# Patient Record
Sex: Male | Born: 1975 | Race: White | Marital: Married | Smoking: Never smoker
Health system: Southern US, Community
[De-identification: ages and names within clinical notes are randomized; demographics above are authoritative.]

## PROBLEM LIST (undated history)

## (undated) HISTORY — PX: WISDOM TOOTH EXTRACTION: SHX21

---

## 2004-10-20 HISTORY — PX: SHOULDER ARTHROSCOPY WITH LABRAL REPAIR: SHX5691

## 2011-10-21 HISTORY — PX: VASECTOMY: SHX75

## 2019-12-05 DIAGNOSIS — H43813 Vitreous degeneration, bilateral: Secondary | ICD-10-CM | POA: Diagnosis not present

## 2019-12-19 DIAGNOSIS — M9904 Segmental and somatic dysfunction of sacral region: Secondary | ICD-10-CM | POA: Diagnosis not present

## 2019-12-19 DIAGNOSIS — M24571 Contracture, right ankle: Secondary | ICD-10-CM | POA: Diagnosis not present

## 2019-12-19 DIAGNOSIS — M9903 Segmental and somatic dysfunction of lumbar region: Secondary | ICD-10-CM | POA: Diagnosis not present

## 2019-12-19 DIAGNOSIS — M24572 Contracture, left ankle: Secondary | ICD-10-CM | POA: Diagnosis not present

## 2019-12-23 DIAGNOSIS — M9903 Segmental and somatic dysfunction of lumbar region: Secondary | ICD-10-CM | POA: Diagnosis not present

## 2019-12-23 DIAGNOSIS — M9904 Segmental and somatic dysfunction of sacral region: Secondary | ICD-10-CM | POA: Diagnosis not present

## 2019-12-23 DIAGNOSIS — M24572 Contracture, left ankle: Secondary | ICD-10-CM | POA: Diagnosis not present

## 2019-12-23 DIAGNOSIS — M24571 Contracture, right ankle: Secondary | ICD-10-CM | POA: Diagnosis not present

## 2019-12-26 DIAGNOSIS — M9903 Segmental and somatic dysfunction of lumbar region: Secondary | ICD-10-CM | POA: Diagnosis not present

## 2019-12-26 DIAGNOSIS — M9904 Segmental and somatic dysfunction of sacral region: Secondary | ICD-10-CM | POA: Diagnosis not present

## 2019-12-26 DIAGNOSIS — M24571 Contracture, right ankle: Secondary | ICD-10-CM | POA: Diagnosis not present

## 2019-12-26 DIAGNOSIS — M24572 Contracture, left ankle: Secondary | ICD-10-CM | POA: Diagnosis not present

## 2019-12-30 DIAGNOSIS — M9903 Segmental and somatic dysfunction of lumbar region: Secondary | ICD-10-CM | POA: Diagnosis not present

## 2019-12-30 DIAGNOSIS — M24572 Contracture, left ankle: Secondary | ICD-10-CM | POA: Diagnosis not present

## 2019-12-30 DIAGNOSIS — M24571 Contracture, right ankle: Secondary | ICD-10-CM | POA: Diagnosis not present

## 2019-12-30 DIAGNOSIS — M9904 Segmental and somatic dysfunction of sacral region: Secondary | ICD-10-CM | POA: Diagnosis not present

## 2020-01-02 ENCOUNTER — Other Ambulatory Visit: Payer: Self-pay | Admitting: Chiropractic Medicine

## 2020-01-02 ENCOUNTER — Ambulatory Visit
Admission: RE | Admit: 2020-01-02 | Discharge: 2020-01-02 | Disposition: A | Discharge status: Home / Self Care | Payer: Federal, State, Local not specified - PPO | Source: Ambulatory Visit | Attending: Chiropractic Medicine | Admitting: Chiropractic Medicine

## 2020-01-02 ENCOUNTER — Other Ambulatory Visit: Payer: Self-pay

## 2020-01-02 DIAGNOSIS — M9903 Segmental and somatic dysfunction of lumbar region: Secondary | ICD-10-CM | POA: Diagnosis not present

## 2020-01-02 DIAGNOSIS — M24572 Contracture, left ankle: Secondary | ICD-10-CM | POA: Diagnosis not present

## 2020-01-02 DIAGNOSIS — M25472 Effusion, left ankle: Secondary | ICD-10-CM

## 2020-01-02 DIAGNOSIS — M9904 Segmental and somatic dysfunction of sacral region: Secondary | ICD-10-CM | POA: Diagnosis not present

## 2020-01-02 DIAGNOSIS — M24571 Contracture, right ankle: Secondary | ICD-10-CM | POA: Diagnosis not present

## 2020-01-02 DIAGNOSIS — M25572 Pain in left ankle and joints of left foot: Secondary | ICD-10-CM | POA: Diagnosis not present

## 2020-01-02 DIAGNOSIS — M7989 Other specified soft tissue disorders: Secondary | ICD-10-CM | POA: Diagnosis not present

## 2020-01-06 DIAGNOSIS — M9903 Segmental and somatic dysfunction of lumbar region: Secondary | ICD-10-CM | POA: Diagnosis not present

## 2020-01-06 DIAGNOSIS — M9904 Segmental and somatic dysfunction of sacral region: Secondary | ICD-10-CM | POA: Diagnosis not present

## 2020-01-06 DIAGNOSIS — M24571 Contracture, right ankle: Secondary | ICD-10-CM | POA: Diagnosis not present

## 2020-01-06 DIAGNOSIS — M24572 Contracture, left ankle: Secondary | ICD-10-CM | POA: Diagnosis not present

## 2020-01-09 DIAGNOSIS — M24571 Contracture, right ankle: Secondary | ICD-10-CM | POA: Diagnosis not present

## 2020-01-09 DIAGNOSIS — M24572 Contracture, left ankle: Secondary | ICD-10-CM | POA: Diagnosis not present

## 2020-01-09 DIAGNOSIS — M9904 Segmental and somatic dysfunction of sacral region: Secondary | ICD-10-CM | POA: Diagnosis not present

## 2020-01-09 DIAGNOSIS — M9903 Segmental and somatic dysfunction of lumbar region: Secondary | ICD-10-CM | POA: Diagnosis not present

## 2020-01-19 DIAGNOSIS — M9904 Segmental and somatic dysfunction of sacral region: Secondary | ICD-10-CM | POA: Diagnosis not present

## 2020-01-19 DIAGNOSIS — M24571 Contracture, right ankle: Secondary | ICD-10-CM | POA: Diagnosis not present

## 2020-01-19 DIAGNOSIS — M9903 Segmental and somatic dysfunction of lumbar region: Secondary | ICD-10-CM | POA: Diagnosis not present

## 2020-01-19 DIAGNOSIS — M24572 Contracture, left ankle: Secondary | ICD-10-CM | POA: Diagnosis not present

## 2020-01-30 DIAGNOSIS — M24572 Contracture, left ankle: Secondary | ICD-10-CM | POA: Diagnosis not present

## 2020-01-30 DIAGNOSIS — M9904 Segmental and somatic dysfunction of sacral region: Secondary | ICD-10-CM | POA: Diagnosis not present

## 2020-01-30 DIAGNOSIS — M9903 Segmental and somatic dysfunction of lumbar region: Secondary | ICD-10-CM | POA: Diagnosis not present

## 2020-01-30 DIAGNOSIS — M24571 Contracture, right ankle: Secondary | ICD-10-CM | POA: Diagnosis not present

## 2020-02-06 ENCOUNTER — Other Ambulatory Visit: Payer: Self-pay | Admitting: Chiropractic Medicine

## 2020-02-06 DIAGNOSIS — M24572 Contracture, left ankle: Secondary | ICD-10-CM | POA: Diagnosis not present

## 2020-02-06 DIAGNOSIS — M9903 Segmental and somatic dysfunction of lumbar region: Secondary | ICD-10-CM | POA: Diagnosis not present

## 2020-02-06 DIAGNOSIS — M9904 Segmental and somatic dysfunction of sacral region: Secondary | ICD-10-CM | POA: Diagnosis not present

## 2020-02-06 DIAGNOSIS — M25572 Pain in left ankle and joints of left foot: Secondary | ICD-10-CM

## 2020-02-06 DIAGNOSIS — M24571 Contracture, right ankle: Secondary | ICD-10-CM | POA: Diagnosis not present

## 2020-02-06 DIAGNOSIS — G8929 Other chronic pain: Secondary | ICD-10-CM

## 2020-02-13 DIAGNOSIS — M9903 Segmental and somatic dysfunction of lumbar region: Secondary | ICD-10-CM | POA: Diagnosis not present

## 2020-02-13 DIAGNOSIS — M24572 Contracture, left ankle: Secondary | ICD-10-CM | POA: Diagnosis not present

## 2020-02-13 DIAGNOSIS — M24571 Contracture, right ankle: Secondary | ICD-10-CM | POA: Diagnosis not present

## 2020-02-13 DIAGNOSIS — M9904 Segmental and somatic dysfunction of sacral region: Secondary | ICD-10-CM | POA: Diagnosis not present

## 2020-02-20 DIAGNOSIS — M24571 Contracture, right ankle: Secondary | ICD-10-CM | POA: Diagnosis not present

## 2020-02-20 DIAGNOSIS — M9904 Segmental and somatic dysfunction of sacral region: Secondary | ICD-10-CM | POA: Diagnosis not present

## 2020-02-20 DIAGNOSIS — M9903 Segmental and somatic dysfunction of lumbar region: Secondary | ICD-10-CM | POA: Diagnosis not present

## 2020-02-20 DIAGNOSIS — M24572 Contracture, left ankle: Secondary | ICD-10-CM | POA: Diagnosis not present

## 2020-02-28 ENCOUNTER — Other Ambulatory Visit: Payer: Self-pay

## 2020-02-28 ENCOUNTER — Ambulatory Visit
Admission: RE | Admit: 2020-02-28 | Discharge: 2020-02-28 | Disposition: A | Discharge status: Home / Self Care | Payer: Federal, State, Local not specified - PPO | Source: Ambulatory Visit | Attending: Chiropractic Medicine | Admitting: Chiropractic Medicine

## 2020-02-28 DIAGNOSIS — M25472 Effusion, left ankle: Secondary | ICD-10-CM | POA: Diagnosis not present

## 2020-02-28 DIAGNOSIS — M79672 Pain in left foot: Secondary | ICD-10-CM

## 2020-02-28 DIAGNOSIS — G8929 Other chronic pain: Secondary | ICD-10-CM

## 2020-03-02 DIAGNOSIS — M24571 Contracture, right ankle: Secondary | ICD-10-CM | POA: Diagnosis not present

## 2020-03-02 DIAGNOSIS — M9903 Segmental and somatic dysfunction of lumbar region: Secondary | ICD-10-CM | POA: Diagnosis not present

## 2020-03-02 DIAGNOSIS — M9904 Segmental and somatic dysfunction of sacral region: Secondary | ICD-10-CM | POA: Diagnosis not present

## 2020-03-02 DIAGNOSIS — M24572 Contracture, left ankle: Secondary | ICD-10-CM | POA: Diagnosis not present

## 2020-05-30 DIAGNOSIS — M25572 Pain in left ankle and joints of left foot: Secondary | ICD-10-CM | POA: Diagnosis not present

## 2020-05-30 DIAGNOSIS — M79672 Pain in left foot: Secondary | ICD-10-CM | POA: Diagnosis not present

## 2020-05-30 DIAGNOSIS — M216X2 Other acquired deformities of left foot: Secondary | ICD-10-CM | POA: Diagnosis not present

## 2020-06-06 DIAGNOSIS — M25372 Other instability, left ankle: Secondary | ICD-10-CM | POA: Diagnosis not present

## 2020-06-06 DIAGNOSIS — M25572 Pain in left ankle and joints of left foot: Secondary | ICD-10-CM | POA: Diagnosis not present

## 2020-06-15 DIAGNOSIS — M25572 Pain in left ankle and joints of left foot: Secondary | ICD-10-CM | POA: Diagnosis not present

## 2020-06-15 DIAGNOSIS — M25372 Other instability, left ankle: Secondary | ICD-10-CM | POA: Diagnosis not present

## 2020-06-20 DIAGNOSIS — M25572 Pain in left ankle and joints of left foot: Secondary | ICD-10-CM | POA: Diagnosis not present

## 2020-06-20 DIAGNOSIS — M25372 Other instability, left ankle: Secondary | ICD-10-CM | POA: Diagnosis not present

## 2020-06-27 DIAGNOSIS — M25572 Pain in left ankle and joints of left foot: Secondary | ICD-10-CM | POA: Diagnosis not present

## 2020-06-27 DIAGNOSIS — M25372 Other instability, left ankle: Secondary | ICD-10-CM | POA: Diagnosis not present

## 2020-07-09 DIAGNOSIS — M25572 Pain in left ankle and joints of left foot: Secondary | ICD-10-CM | POA: Diagnosis not present

## 2020-07-09 DIAGNOSIS — M25372 Other instability, left ankle: Secondary | ICD-10-CM | POA: Diagnosis not present

## 2020-08-01 DIAGNOSIS — M25372 Other instability, left ankle: Secondary | ICD-10-CM | POA: Diagnosis not present

## 2020-08-08 DIAGNOSIS — Z8042 Family history of malignant neoplasm of prostate: Secondary | ICD-10-CM | POA: Diagnosis not present

## 2020-08-08 DIAGNOSIS — Z125 Encounter for screening for malignant neoplasm of prostate: Secondary | ICD-10-CM | POA: Diagnosis not present

## 2020-10-22 DIAGNOSIS — D2261 Melanocytic nevi of right upper limb, including shoulder: Secondary | ICD-10-CM | POA: Diagnosis not present

## 2020-10-22 DIAGNOSIS — D2262 Melanocytic nevi of left upper limb, including shoulder: Secondary | ICD-10-CM | POA: Diagnosis not present

## 2020-10-22 DIAGNOSIS — D224 Melanocytic nevi of scalp and neck: Secondary | ICD-10-CM | POA: Diagnosis not present

## 2020-10-22 DIAGNOSIS — D225 Melanocytic nevi of trunk: Secondary | ICD-10-CM | POA: Diagnosis not present

## 2021-04-29 IMAGING — CR DG ANKLE COMPLETE 3+V*L*
3 series · 3 of 3 positions shown · non-contrast
Comparison: None.

CLINICAL DATA: Chronic pain and swelling

EXAM:
LEFT ANKLE COMPLETE - 3+ VIEW

[t ankle joint ap left]
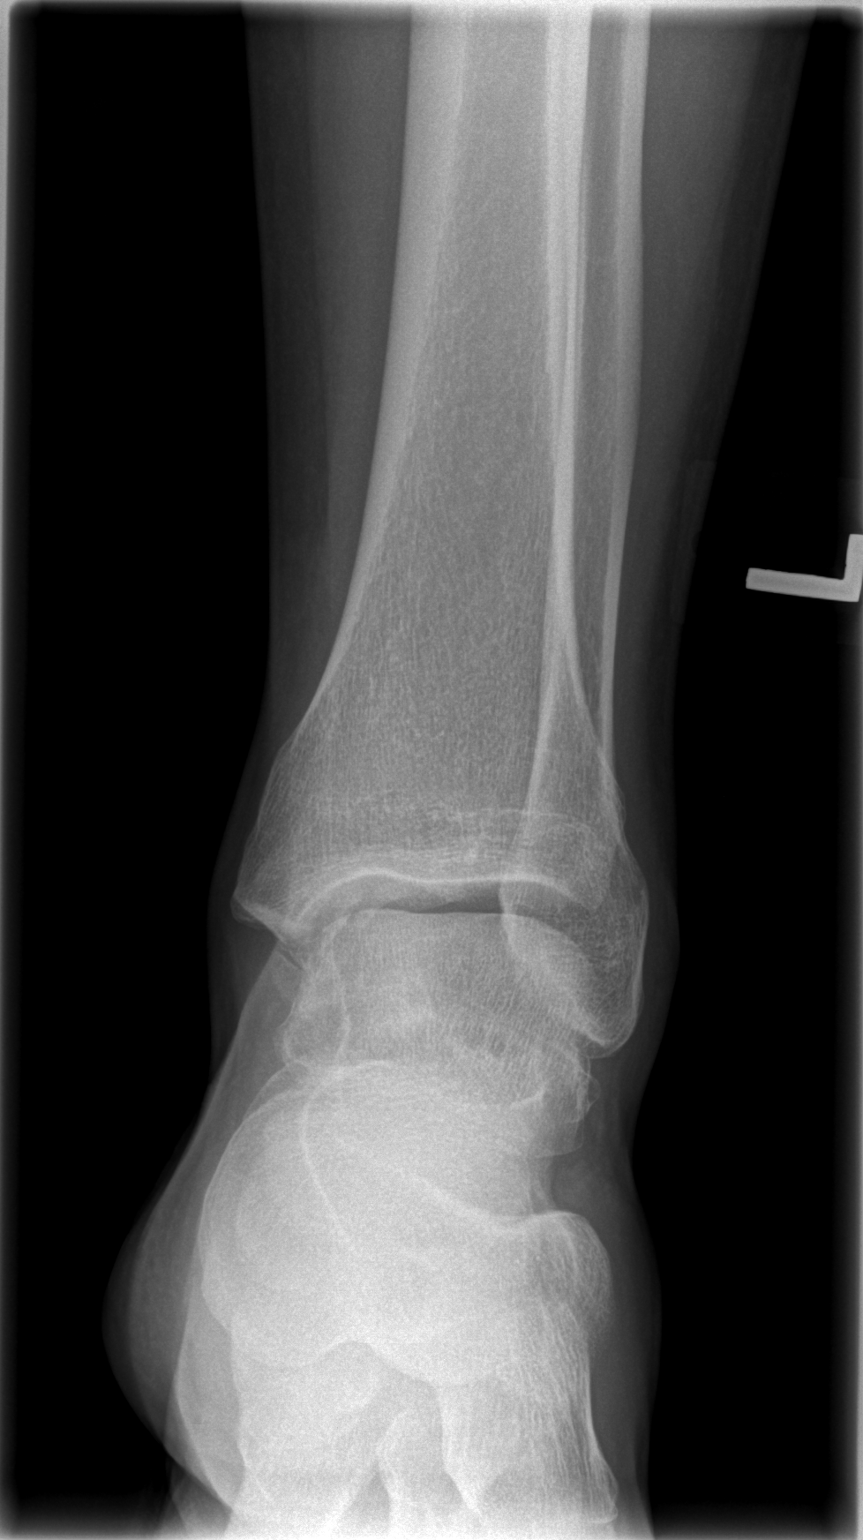

[t ankle joint oblique left]
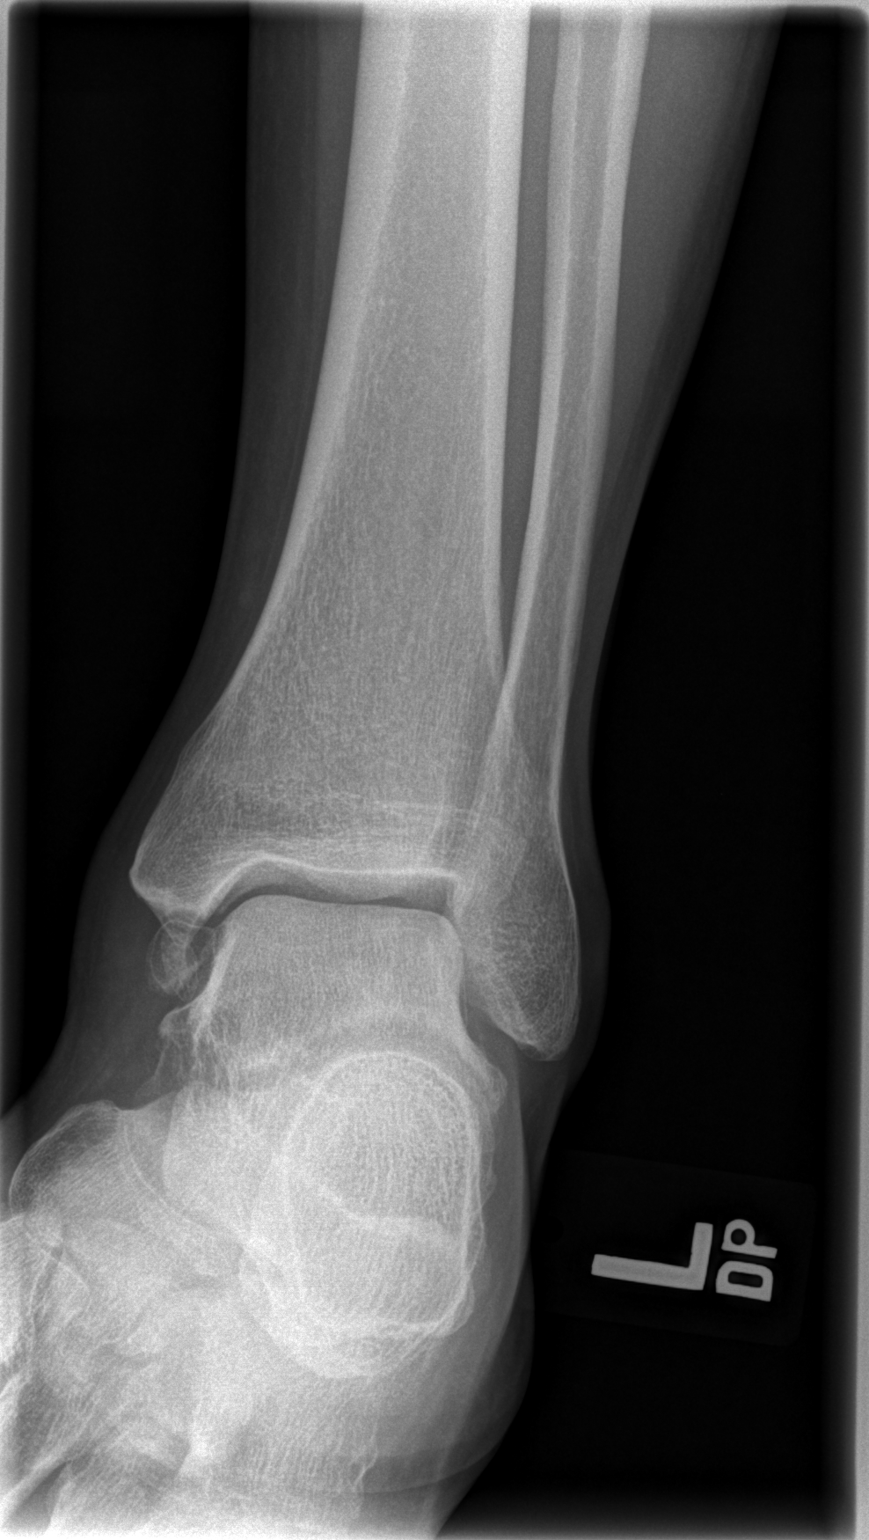

[t ankle joint lat left]
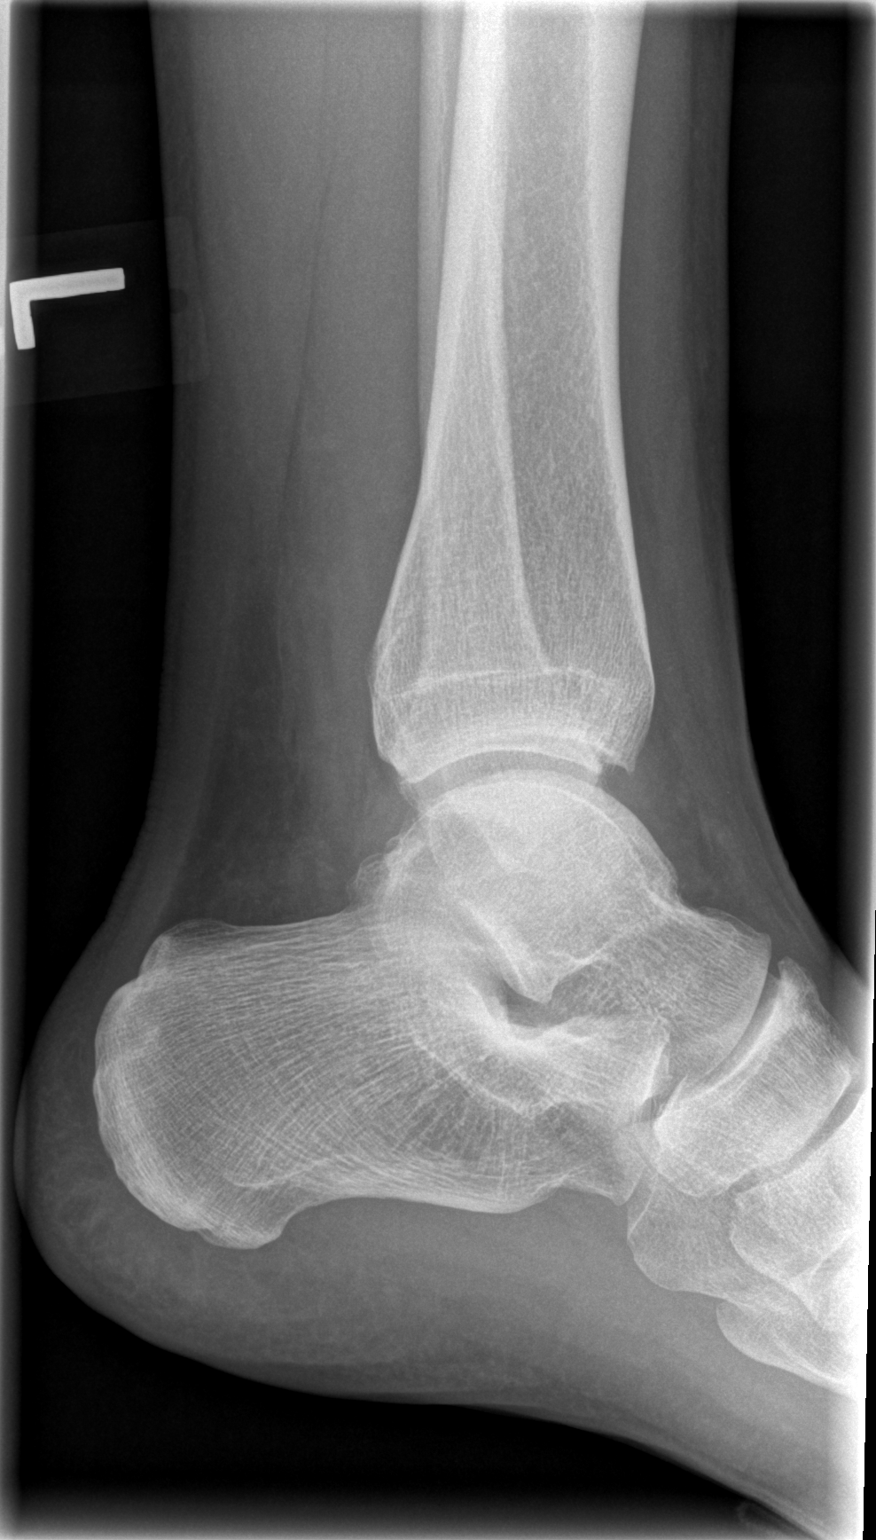

[3 of 3 positions shown; findings below may reference images not displayed]

FINDINGS: Frontal, oblique, and lateral views were obtained. No acute fracture
is demonstrable. No joint effusion. There is calcification in the
medial malleolar region which is well corticated and may represent
residua of prior trauma. There is no appreciable joint space
narrowing or erosion. The ankle mortise appears intact.
IMPRESSION: No acute fracture evident. Suspect residua of old trauma in the
medial malleolus. No appreciable joint space narrowing. Ankle
mortise appears intact.

## 2021-08-05 ENCOUNTER — Encounter: Payer: Self-pay | Admitting: Gastroenterology

## 2021-08-07 DIAGNOSIS — Z8042 Family history of malignant neoplasm of prostate: Secondary | ICD-10-CM | POA: Diagnosis not present

## 2021-08-07 DIAGNOSIS — Z125 Encounter for screening for malignant neoplasm of prostate: Secondary | ICD-10-CM | POA: Diagnosis not present

## 2021-08-20 DIAGNOSIS — K580 Irritable bowel syndrome with diarrhea: Secondary | ICD-10-CM | POA: Diagnosis not present

## 2021-08-20 DIAGNOSIS — Z1331 Encounter for screening for depression: Secondary | ICD-10-CM | POA: Diagnosis not present

## 2021-08-20 DIAGNOSIS — Z1339 Encounter for screening examination for other mental health and behavioral disorders: Secondary | ICD-10-CM | POA: Diagnosis not present

## 2021-09-03 ENCOUNTER — Ambulatory Visit (AMBULATORY_SURGERY_CENTER): Payer: Federal, State, Local not specified - PPO

## 2021-09-03 ENCOUNTER — Telehealth: Payer: Self-pay

## 2021-09-03 ENCOUNTER — Other Ambulatory Visit: Payer: Self-pay

## 2021-09-03 VITALS — Ht 70.0 in | Wt 185.0 lb

## 2021-09-03 DIAGNOSIS — Z1211 Encounter for screening for malignant neoplasm of colon: Secondary | ICD-10-CM

## 2021-09-03 MED ORDER — PLENVU 140 G PO SOLR: 1.0000 | ORAL | 0 refills | Status: DC

## 2021-09-03 NOTE — Progress Notes (Signed)
Pre visit completed via phone call; Patient verified name, DOB, and address;  No egg or soy allergy known to patient  No issues known to pt with past sedation with any surgeries or procedures Patient denies ever being told they had issues or difficulty with intubation  No FH of Malignant Hyperthermia Pt is not on diet pills Pt is not on home 02  Pt is not on blood thinners  Pt denies issues with constipation at this time;  No A fib or A flutter Pt is fully vaccinated for Covid x 2 + booster; Coupon given to pt in PV today, Code to Pharmacy and NO PA's for preps discussed with pt in PV today  Discussed with pt there will be an out-of-pocket cost for prep and that varies from $0 to 70 +  dollars - pt verbalized understanding  Due to the COVID-19 pandemic we are asking patients to follow certain guidelines in PV and the [facility]   Pt aware of COVID protocols and [facility] guidelines

## 2021-09-03 NOTE — Telephone Encounter (Signed)
Multiple attempts made to reach patient- no answer-message left for patient to call back to the office to reschedule PV appt- if patient fails to call back to the office prior to end of business day ---PV and procedure appts will be cancelled and a no show letter will be sent to the patient;

## 2021-09-17 ENCOUNTER — Encounter: Payer: Self-pay | Admitting: Certified Registered Nurse Anesthetist

## 2021-09-18 ENCOUNTER — Ambulatory Visit (AMBULATORY_SURGERY_CENTER): Payer: Federal, State, Local not specified - PPO | Admitting: Gastroenterology

## 2021-09-18 ENCOUNTER — Other Ambulatory Visit: Payer: Self-pay

## 2021-09-18 ENCOUNTER — Encounter: Payer: Self-pay | Admitting: Gastroenterology

## 2021-09-18 VITALS — BP 114/80 | HR 60 | Temp 97.7°F | Resp 9 | Ht 70.0 in | Wt 185.0 lb

## 2021-09-18 DIAGNOSIS — D127 Benign neoplasm of rectosigmoid junction: Secondary | ICD-10-CM | POA: Diagnosis not present

## 2021-09-18 DIAGNOSIS — Z1211 Encounter for screening for malignant neoplasm of colon: Secondary | ICD-10-CM

## 2021-09-18 MED ORDER — SODIUM CHLORIDE 0.9 % IV SOLN: 500.0000 mL | Freq: Once | INTRAVENOUS | Status: DC

## 2021-09-18 NOTE — Op Note (Signed)
[facility] Patient Name: Clifford Booker Procedure Date: 09/18/2021 10:23 AM MRN: [id number] Endoscopist: Mauri Pole , MD Age: 45 Referring MD:  Date of Birth: [birth year] Gender: Male Account #: [id number] Procedure:                Colonoscopy Indications:              Screening for colorectal malignant neoplasm Medicines:                Monitored Anesthesia Care Procedure:                Pre-Anesthesia Assessment:                           - Prior to the procedure, a History and Physical                            was performed, and patient medications and                            allergies were reviewed. The patient's tolerance of                            previous anesthesia was also reviewed. The risks                            and benefits of the procedure and the sedation                            options and risks were discussed with the patient.                            All questions were answered, and informed consent                            was obtained. Prior Anticoagulants: The patient has                            taken no previous anticoagulant or antiplatelet                            agents. ASA Grade Assessment: I - A normal, healthy                            patient. After reviewing the risks and benefits,                            the patient was deemed in satisfactory condition to                            undergo the procedure.                           After obtaining informed consent, the colonoscope  was passed under direct vision. Throughout the                            procedure, the patient's blood pressure, pulse, and                            oxygen saturations were monitored continuously. The                            Olympus PCF-H190DL ([id number]) Colonoscope was                            introduced through the anus and advanced to the the                            cecum, identified by  appendiceal orifice and                            ileocecal valve. The colonoscopy was performed                            without difficulty. The patient tolerated the                            procedure well. The quality of the bowel                            preparation was excellent. The ileocecal valve,                            appendiceal orifice, and rectum were photographed. Scope In: 10:29:53 AM Scope Out: 10:44:39 AM Scope Withdrawal Time: 0 hours 9 minutes 19 seconds  Total Procedure Duration: 0 hours 14 minutes 46 seconds  Findings:                 The perianal and digital rectal examinations were                            normal.                           A 8 mm polyp was found in the recto-sigmoid colon.                            The polyp was sessile. The polyp was removed with a                            cold snare. Resection and retrieval were complete.                           Scattered small and large-mouthed diverticula were                            found in the sigmoid colon.  Non-bleeding internal hemorrhoids were found during                            retroflexion. The hemorrhoids were medium-sized. Complications:            No immediate complications. Estimated Blood Loss:     Estimated blood loss was minimal. Impression:               - One 8 mm polyp at the recto-sigmoid colon,                            removed with a cold snare. Resected and retrieved.                           - Diverticulosis in the sigmoid colon.                           - Non-bleeding internal hemorrhoids. Recommendation:           - Patient has a contact number available for                            emergencies. The signs and symptoms of potential                            delayed complications were discussed with the                            patient. Return to normal activities tomorrow.                            Written discharge instructions  were provided to the                            patient.                           - Resume previous diet.                           - Continue present medications.                           - Await pathology results.                           - Repeat colonoscopy in 5-10 years for surveillance                            based on pathology results. Mauri Pole, MD 09/18/2021 10:48:58 AM This report has been signed electronically.

## 2021-09-18 NOTE — Progress Notes (Signed)
Report given to PACU, vss 

## 2021-09-18 NOTE — Progress Notes (Signed)
VS completed by DT.  Pt's states no medical or surgical changes since previsit or office visit.  

## 2021-09-18 NOTE — Progress Notes (Signed)
[facility] History and Physical   Primary Care Physician:  Patient, No Pcp Per (Inactive)   Reason for Procedure:  Colorectal cancer screening  Plan:    Screening colonoscopy with possible interventions as needed     HPI: Clifford Booker is a very pleasant 45 y.o. malehere for screening colonoscopy. Denies any nausea, vomiting, abdominal pain, melena or bright red blood per rectum  The risks and benefits as well as alternatives of endoscopic procedure(s) have been discussed and reviewed. All questions answered. The patient agrees to proceed.    History reviewed. No pertinent past medical history.  Past Surgical History:  Procedure Laterality Date   SHOULDER ARTHROSCOPY WITH LABRAL REPAIR Left 2006   VASECTOMY  2013   WISDOM TOOTH EXTRACTION      Prior to Admission medications   Medication Sig Start Date End Date Taking? Authorizing Provider  hyoscyamine (LEVBID) 0.375 MG 12 hr tablet Take 0.375 mg by mouth daily. 05/27/21   [provider]    Current Outpatient Medications  Medication Sig Dispense Refill   hyoscyamine (LEVBID) 0.375 MG 12 hr tablet Take 0.375 mg by mouth daily.     Current Facility-Administered Medications  Medication Dose Route Frequency Provider Last Rate Last Admin   0.9 %  sodium chloride infusion  500 mL Intravenous Once Mauri Pole, MD        Allergies as of 09/18/2021   (No Known Allergies)    Family History  Problem Relation Age of Onset   Prostate cancer Father 33   Colon cancer Neg Hx    Colon polyps Neg Hx    Esophageal cancer Neg Hx    Rectal cancer Neg Hx    Stomach cancer Neg Hx     Social History   Socioeconomic History   Marital status: Married    Spouse name: Not on file   Number of children: Not on file   Years of education: Not on file   Highest education level: Not on file  Occupational History   Not on file  Tobacco Use   Smoking status: Never   Smokeless tobacco: Never  Vaping Use    Vaping Use: Never used  Substance and Sexual Activity   Alcohol use: Not Currently    Alcohol/week: 2.0 standard drinks    Types: 2 Standard drinks or equivalent per week   Drug use: Never   Sexual activity: Not on file  Other Topics Concern   Not on file  Social History Narrative   Not on file   Social Determinants of Health   Financial Resource Strain: Not on file  Food Insecurity: Not on file  Transportation Needs: Not on file  Physical Activity: Not on file  Stress: Not on file  Social Connections: Not on file  Intimate Partner Violence: Not on file    Review of Systems:  All other review of systems negative except as mentioned in the HPI.  Physical Exam: Vital signs in last 24 hours: BP 113/61   Pulse 71   Temp 97.7 F (36.5 C) (Temporal)   Ht 5\' 10"  (1.778 m)   Wt 185 lb (83.9 kg)   SpO2 98%   BMI 26.54 kg/m     General:   Alert, NAD Lungs:  Clear .   Heart:  Regular rate and rhythm Abdomen:  Soft, nontender and nondistended. Neuro/Psych:  Alert and cooperative. Normal mood and affect. A and O x 3  Reviewed labs, radiology imaging, old records and pertinent past GI  work up  Patient is appropriate for planned procedure(s) and anesthesia in an ambulatory setting   K. Denzil Magnuson , MD [phone]

## 2021-09-18 NOTE — Progress Notes (Signed)
Called to room to assist during endoscopic procedure.  Patient ID and intended procedure confirmed with present staff. Received instructions for my participation in the procedure from the performing physician.  

## 2021-09-18 NOTE — Patient Instructions (Signed)
Handouts Provided:  Polyps  YOU HAD AN ENDOSCOPIC PROCEDURE TODAY AT THE [facility]:   Refer to the procedure report that was given to you for any specific questions about what was found during the examination.  If the procedure report does not answer your questions, please call your gastroenterologist to clarify.  If you requested that your care partner not be given the details of your procedure findings, then the procedure report has been included in a sealed envelope for you to review at your convenience later.  YOU SHOULD EXPECT: Some feelings of bloating in the abdomen. Passage of more gas than usual.  Walking can help get rid of the air that was put into your GI tract during the procedure and reduce the bloating. If you had a lower endoscopy (such as a colonoscopy or flexible sigmoidoscopy) you may notice spotting of blood in your stool or on the toilet paper. If you underwent a bowel prep for your procedure, you may not have a normal bowel movement for a few days.  Please Note:  You might notice some irritation and congestion in your nose or some drainage.  This is from the oxygen used during your procedure.  There is no need for concern and it should clear up in a day or so.  SYMPTOMS TO REPORT IMMEDIATELY:   Following lower endoscopy (colonoscopy or flexible sigmoidoscopy):  Excessive amounts of blood in the stool  Significant tenderness or worsening of abdominal pains  Swelling of the abdomen that is new, acute  Fever of 100F or higher  For urgent or emergent issues, a gastroenterologist can be reached at any hour by calling [phone]. Do not use MyChart messaging for urgent concerns.    DIET:  We do recommend a small meal at first, but then you may proceed to your regular diet.  Drink plenty of fluids but you should avoid alcoholic beverages for 24 hours.  ACTIVITY:  You should plan to take it easy for the rest of today and you should NOT DRIVE or use heavy  machinery until tomorrow (because of the sedation medicines used during the test).    FOLLOW UP: Our staff will call the number listed on your records 48-72 hours following your procedure to check on you and address any questions or concerns that you may have regarding the information given to you following your procedure. If we do not reach you, we will leave a message.  We will attempt to reach you two times.  During this call, we will ask if you have developed any symptoms of COVID 19. If you develop any symptoms (ie: fever, flu-like symptoms, shortness of breath, cough etc.) before then, please call [phone].  If you test positive for Covid 19 in the 2 weeks post procedure, please call and report this information to us.    If any biopsies were taken you will be contacted by phone or by letter within the next 1-3 weeks.  Please call us at [phone] if you have not heard about the biopsies in 3 weeks.    SIGNATURES/CONFIDENTIALITY: You and/or your care partner have signed paperwork which will be entered into your electronic medical record.  These signatures attest to the fact that that the information above on your After Visit Summary has been reviewed and is understood.  Full responsibility of the confidentiality of this discharge information lies with you and/or your care-partner.  

## 2021-09-20 ENCOUNTER — Telehealth: Payer: Self-pay

## 2021-09-20 NOTE — Telephone Encounter (Signed)
  Follow up Call-  Call back number 09/18/2021  Post procedure Call Back phone  # [phone]  Permission to leave phone message Yes     Patient questions:  Do you have a fever, pain , or abdominal swelling? No. Pain Score  0 *  Have you tolerated food without any problems? Yes.    Have you been able to return to your normal activities? Yes.    Do you have any questions about your discharge instructions: Diet   No. Medications  No. Follow up visit  No.  Do you have questions or concerns about your Care? No.  Actions: * If pain score is 4 or above: No action needed, pain <4. Have you developed a fever since your procedure? no  2.   Have you had an respiratory symptoms (SOB or cough) since your procedure? no  3.   Have you tested positive for COVID 19 since your procedure no  4.   Have you had any family members/close contacts diagnosed with the COVID 19 since your procedure?  no   If yes to any of these questions please route to Joylene John, RN and Joella Prince, RN

## 2021-10-01 ENCOUNTER — Encounter: Payer: Self-pay | Admitting: Gastroenterology

## 2021-10-15 DIAGNOSIS — M9905 Segmental and somatic dysfunction of pelvic region: Secondary | ICD-10-CM | POA: Diagnosis not present

## 2021-10-15 DIAGNOSIS — M9903 Segmental and somatic dysfunction of lumbar region: Secondary | ICD-10-CM | POA: Diagnosis not present

## 2021-10-15 DIAGNOSIS — M25552 Pain in left hip: Secondary | ICD-10-CM | POA: Diagnosis not present

## 2021-10-15 DIAGNOSIS — M9904 Segmental and somatic dysfunction of sacral region: Secondary | ICD-10-CM | POA: Diagnosis not present

## 2021-10-17 DIAGNOSIS — M25552 Pain in left hip: Secondary | ICD-10-CM | POA: Diagnosis not present

## 2021-10-17 DIAGNOSIS — M9905 Segmental and somatic dysfunction of pelvic region: Secondary | ICD-10-CM | POA: Diagnosis not present

## 2021-10-17 DIAGNOSIS — M9903 Segmental and somatic dysfunction of lumbar region: Secondary | ICD-10-CM | POA: Diagnosis not present

## 2021-10-17 DIAGNOSIS — M9904 Segmental and somatic dysfunction of sacral region: Secondary | ICD-10-CM | POA: Diagnosis not present

## 2021-10-22 DIAGNOSIS — L738 Other specified follicular disorders: Secondary | ICD-10-CM | POA: Diagnosis not present

## 2021-10-22 DIAGNOSIS — D225 Melanocytic nevi of trunk: Secondary | ICD-10-CM | POA: Diagnosis not present

## 2021-10-22 DIAGNOSIS — D2262 Melanocytic nevi of left upper limb, including shoulder: Secondary | ICD-10-CM | POA: Diagnosis not present

## 2021-10-22 DIAGNOSIS — L700 Acne vulgaris: Secondary | ICD-10-CM | POA: Diagnosis not present

## 2021-10-23 DIAGNOSIS — M9903 Segmental and somatic dysfunction of lumbar region: Secondary | ICD-10-CM | POA: Diagnosis not present

## 2021-10-23 DIAGNOSIS — M25552 Pain in left hip: Secondary | ICD-10-CM | POA: Diagnosis not present

## 2021-10-23 DIAGNOSIS — M9905 Segmental and somatic dysfunction of pelvic region: Secondary | ICD-10-CM | POA: Diagnosis not present

## 2021-10-23 DIAGNOSIS — M9904 Segmental and somatic dysfunction of sacral region: Secondary | ICD-10-CM | POA: Diagnosis not present

## 2021-11-04 DIAGNOSIS — M25552 Pain in left hip: Secondary | ICD-10-CM | POA: Diagnosis not present

## 2021-11-04 DIAGNOSIS — M9903 Segmental and somatic dysfunction of lumbar region: Secondary | ICD-10-CM | POA: Diagnosis not present

## 2021-11-04 DIAGNOSIS — M9904 Segmental and somatic dysfunction of sacral region: Secondary | ICD-10-CM | POA: Diagnosis not present

## 2021-11-04 DIAGNOSIS — M9905 Segmental and somatic dysfunction of pelvic region: Secondary | ICD-10-CM | POA: Diagnosis not present

## 2021-11-08 DIAGNOSIS — M25552 Pain in left hip: Secondary | ICD-10-CM | POA: Diagnosis not present

## 2021-11-08 DIAGNOSIS — M9903 Segmental and somatic dysfunction of lumbar region: Secondary | ICD-10-CM | POA: Diagnosis not present

## 2021-11-08 DIAGNOSIS — M9905 Segmental and somatic dysfunction of pelvic region: Secondary | ICD-10-CM | POA: Diagnosis not present

## 2021-11-08 DIAGNOSIS — M9904 Segmental and somatic dysfunction of sacral region: Secondary | ICD-10-CM | POA: Diagnosis not present

## 2021-11-20 DIAGNOSIS — M9904 Segmental and somatic dysfunction of sacral region: Secondary | ICD-10-CM | POA: Diagnosis not present

## 2021-11-20 DIAGNOSIS — M25552 Pain in left hip: Secondary | ICD-10-CM | POA: Diagnosis not present

## 2021-11-20 DIAGNOSIS — M9905 Segmental and somatic dysfunction of pelvic region: Secondary | ICD-10-CM | POA: Diagnosis not present

## 2021-11-20 DIAGNOSIS — M9903 Segmental and somatic dysfunction of lumbar region: Secondary | ICD-10-CM | POA: Diagnosis not present

## 2021-11-25 DIAGNOSIS — J069 Acute upper respiratory infection, unspecified: Secondary | ICD-10-CM | POA: Diagnosis not present

## 2022-04-28 DIAGNOSIS — M2391 Unspecified internal derangement of right knee: Secondary | ICD-10-CM | POA: Diagnosis not present

## 2022-04-28 DIAGNOSIS — M25561 Pain in right knee: Secondary | ICD-10-CM | POA: Diagnosis not present

## 2022-05-05 DIAGNOSIS — M25561 Pain in right knee: Secondary | ICD-10-CM | POA: Diagnosis not present

## 2022-05-21 DIAGNOSIS — S83206A Unspecified tear of unspecified meniscus, current injury, right knee, initial encounter: Secondary | ICD-10-CM | POA: Diagnosis not present

## 2022-05-21 DIAGNOSIS — M2241 Chondromalacia patellae, right knee: Secondary | ICD-10-CM | POA: Diagnosis not present

## 2022-06-12 DIAGNOSIS — M948X6 Other specified disorders of cartilage, lower leg: Secondary | ICD-10-CM | POA: Diagnosis not present

## 2022-06-12 DIAGNOSIS — M2241 Chondromalacia patellae, right knee: Secondary | ICD-10-CM | POA: Diagnosis not present

## 2022-06-12 DIAGNOSIS — Y999 Unspecified external cause status: Secondary | ICD-10-CM | POA: Diagnosis not present

## 2022-06-12 DIAGNOSIS — M6751 Plica syndrome, right knee: Secondary | ICD-10-CM | POA: Diagnosis not present

## 2022-06-12 DIAGNOSIS — M94261 Chondromalacia, right knee: Secondary | ICD-10-CM | POA: Diagnosis not present

## 2022-06-12 DIAGNOSIS — S83231A Complex tear of medial meniscus, current injury, right knee, initial encounter: Secondary | ICD-10-CM | POA: Diagnosis not present

## 2022-06-12 DIAGNOSIS — G8918 Other acute postprocedural pain: Secondary | ICD-10-CM | POA: Diagnosis not present

## 2022-06-12 DIAGNOSIS — X58XXXA Exposure to other specified factors, initial encounter: Secondary | ICD-10-CM | POA: Diagnosis not present

## 2022-08-08 DIAGNOSIS — Z125 Encounter for screening for malignant neoplasm of prostate: Secondary | ICD-10-CM | POA: Diagnosis not present

## 2022-08-08 DIAGNOSIS — Z8042 Family history of malignant neoplasm of prostate: Secondary | ICD-10-CM | POA: Diagnosis not present

## 2022-09-01 DIAGNOSIS — Z125 Encounter for screening for malignant neoplasm of prostate: Secondary | ICD-10-CM | POA: Diagnosis not present

## 2022-09-01 DIAGNOSIS — R7989 Other specified abnormal findings of blood chemistry: Secondary | ICD-10-CM | POA: Diagnosis not present

## 2022-09-01 DIAGNOSIS — Z0000 Encounter for general adult medical examination without abnormal findings: Secondary | ICD-10-CM | POA: Diagnosis not present

## 2022-09-08 DIAGNOSIS — R82998 Other abnormal findings in urine: Secondary | ICD-10-CM | POA: Diagnosis not present

## 2022-09-08 DIAGNOSIS — Z1331 Encounter for screening for depression: Secondary | ICD-10-CM | POA: Diagnosis not present

## 2022-09-08 DIAGNOSIS — K580 Irritable bowel syndrome with diarrhea: Secondary | ICD-10-CM | POA: Diagnosis not present

## 2022-09-08 DIAGNOSIS — Z1339 Encounter for screening examination for other mental health and behavioral disorders: Secondary | ICD-10-CM | POA: Diagnosis not present

## 2022-09-08 DIAGNOSIS — Z0000 Encounter for general adult medical examination without abnormal findings: Secondary | ICD-10-CM | POA: Diagnosis not present

## 2022-10-21 DIAGNOSIS — D225 Melanocytic nevi of trunk: Secondary | ICD-10-CM | POA: Diagnosis not present

## 2022-10-21 DIAGNOSIS — D224 Melanocytic nevi of scalp and neck: Secondary | ICD-10-CM | POA: Diagnosis not present

## 2022-10-21 DIAGNOSIS — L723 Sebaceous cyst: Secondary | ICD-10-CM | POA: Diagnosis not present

## 2022-10-21 DIAGNOSIS — D2261 Melanocytic nevi of right upper limb, including shoulder: Secondary | ICD-10-CM | POA: Diagnosis not present

## 2023-02-18 DIAGNOSIS — M25561 Pain in right knee: Secondary | ICD-10-CM | POA: Diagnosis not present

## 2023-02-18 DIAGNOSIS — M25552 Pain in left hip: Secondary | ICD-10-CM | POA: Diagnosis not present

## 2023-03-04 DIAGNOSIS — R102 Pelvic and perineal pain: Secondary | ICD-10-CM | POA: Diagnosis not present

## 2023-03-11 DIAGNOSIS — S76012A Strain of muscle, fascia and tendon of left hip, initial encounter: Secondary | ICD-10-CM | POA: Diagnosis not present

## 2023-08-05 DIAGNOSIS — R29898 Other symptoms and signs involving the musculoskeletal system: Secondary | ICD-10-CM | POA: Diagnosis not present

## 2023-08-05 DIAGNOSIS — M25661 Stiffness of right knee, not elsewhere classified: Secondary | ICD-10-CM | POA: Diagnosis not present

## 2023-08-05 DIAGNOSIS — M25561 Pain in right knee: Secondary | ICD-10-CM | POA: Diagnosis not present

## 2023-08-05 DIAGNOSIS — M7651 Patellar tendinitis, right knee: Secondary | ICD-10-CM | POA: Diagnosis not present

## 2023-08-11 DIAGNOSIS — M25561 Pain in right knee: Secondary | ICD-10-CM | POA: Diagnosis not present

## 2023-08-11 DIAGNOSIS — M25661 Stiffness of right knee, not elsewhere classified: Secondary | ICD-10-CM | POA: Diagnosis not present

## 2023-08-11 DIAGNOSIS — M7651 Patellar tendinitis, right knee: Secondary | ICD-10-CM | POA: Diagnosis not present

## 2023-08-11 DIAGNOSIS — R29898 Other symptoms and signs involving the musculoskeletal system: Secondary | ICD-10-CM | POA: Diagnosis not present

## 2023-08-13 DIAGNOSIS — M7651 Patellar tendinitis, right knee: Secondary | ICD-10-CM | POA: Diagnosis not present

## 2023-08-13 DIAGNOSIS — M25661 Stiffness of right knee, not elsewhere classified: Secondary | ICD-10-CM | POA: Diagnosis not present

## 2023-08-13 DIAGNOSIS — R29898 Other symptoms and signs involving the musculoskeletal system: Secondary | ICD-10-CM | POA: Diagnosis not present

## 2023-08-13 DIAGNOSIS — M25561 Pain in right knee: Secondary | ICD-10-CM | POA: Diagnosis not present

## 2023-08-18 DIAGNOSIS — M25661 Stiffness of right knee, not elsewhere classified: Secondary | ICD-10-CM | POA: Diagnosis not present

## 2023-08-18 DIAGNOSIS — M25561 Pain in right knee: Secondary | ICD-10-CM | POA: Diagnosis not present

## 2023-08-18 DIAGNOSIS — R29898 Other symptoms and signs involving the musculoskeletal system: Secondary | ICD-10-CM | POA: Diagnosis not present

## 2023-08-18 DIAGNOSIS — M7651 Patellar tendinitis, right knee: Secondary | ICD-10-CM | POA: Diagnosis not present

## 2023-09-01 DIAGNOSIS — R29898 Other symptoms and signs involving the musculoskeletal system: Secondary | ICD-10-CM | POA: Diagnosis not present

## 2023-09-01 DIAGNOSIS — M25561 Pain in right knee: Secondary | ICD-10-CM | POA: Diagnosis not present

## 2023-09-01 DIAGNOSIS — M7651 Patellar tendinitis, right knee: Secondary | ICD-10-CM | POA: Diagnosis not present

## 2023-09-01 DIAGNOSIS — M25661 Stiffness of right knee, not elsewhere classified: Secondary | ICD-10-CM | POA: Diagnosis not present

## 2023-09-04 DIAGNOSIS — M25561 Pain in right knee: Secondary | ICD-10-CM | POA: Diagnosis not present

## 2023-09-04 DIAGNOSIS — M7651 Patellar tendinitis, right knee: Secondary | ICD-10-CM | POA: Diagnosis not present

## 2023-09-04 DIAGNOSIS — M25661 Stiffness of right knee, not elsewhere classified: Secondary | ICD-10-CM | POA: Diagnosis not present

## 2023-09-04 DIAGNOSIS — R29898 Other symptoms and signs involving the musculoskeletal system: Secondary | ICD-10-CM | POA: Diagnosis not present

## 2023-09-07 DIAGNOSIS — Z0000 Encounter for general adult medical examination without abnormal findings: Secondary | ICD-10-CM | POA: Diagnosis not present

## 2023-09-07 DIAGNOSIS — Z125 Encounter for screening for malignant neoplasm of prostate: Secondary | ICD-10-CM | POA: Diagnosis not present

## 2023-09-08 DIAGNOSIS — M25661 Stiffness of right knee, not elsewhere classified: Secondary | ICD-10-CM | POA: Diagnosis not present

## 2023-09-08 DIAGNOSIS — M25561 Pain in right knee: Secondary | ICD-10-CM | POA: Diagnosis not present

## 2023-09-08 DIAGNOSIS — R29898 Other symptoms and signs involving the musculoskeletal system: Secondary | ICD-10-CM | POA: Diagnosis not present

## 2023-09-08 DIAGNOSIS — M7651 Patellar tendinitis, right knee: Secondary | ICD-10-CM | POA: Diagnosis not present

## 2023-09-10 DIAGNOSIS — M25561 Pain in right knee: Secondary | ICD-10-CM | POA: Diagnosis not present

## 2023-09-10 DIAGNOSIS — M7651 Patellar tendinitis, right knee: Secondary | ICD-10-CM | POA: Diagnosis not present

## 2023-09-10 DIAGNOSIS — R29898 Other symptoms and signs involving the musculoskeletal system: Secondary | ICD-10-CM | POA: Diagnosis not present

## 2023-09-10 DIAGNOSIS — M25661 Stiffness of right knee, not elsewhere classified: Secondary | ICD-10-CM | POA: Diagnosis not present

## 2023-09-21 DIAGNOSIS — M25561 Pain in right knee: Secondary | ICD-10-CM | POA: Diagnosis not present

## 2023-09-21 DIAGNOSIS — M25661 Stiffness of right knee, not elsewhere classified: Secondary | ICD-10-CM | POA: Diagnosis not present

## 2023-09-21 DIAGNOSIS — M7651 Patellar tendinitis, right knee: Secondary | ICD-10-CM | POA: Diagnosis not present

## 2023-09-21 DIAGNOSIS — R29898 Other symptoms and signs involving the musculoskeletal system: Secondary | ICD-10-CM | POA: Diagnosis not present

## 2023-09-24 DIAGNOSIS — M25661 Stiffness of right knee, not elsewhere classified: Secondary | ICD-10-CM | POA: Diagnosis not present

## 2023-09-24 DIAGNOSIS — M7651 Patellar tendinitis, right knee: Secondary | ICD-10-CM | POA: Diagnosis not present

## 2023-09-24 DIAGNOSIS — M25561 Pain in right knee: Secondary | ICD-10-CM | POA: Diagnosis not present

## 2023-09-24 DIAGNOSIS — R29898 Other symptoms and signs involving the musculoskeletal system: Secondary | ICD-10-CM | POA: Diagnosis not present

## 2023-09-25 DIAGNOSIS — K580 Irritable bowel syndrome with diarrhea: Secondary | ICD-10-CM | POA: Diagnosis not present

## 2023-09-25 DIAGNOSIS — R82998 Other abnormal findings in urine: Secondary | ICD-10-CM | POA: Diagnosis not present

## 2023-09-25 DIAGNOSIS — Z1339 Encounter for screening examination for other mental health and behavioral disorders: Secondary | ICD-10-CM | POA: Diagnosis not present

## 2023-09-25 DIAGNOSIS — Z1331 Encounter for screening for depression: Secondary | ICD-10-CM | POA: Diagnosis not present

## 2023-09-25 DIAGNOSIS — Z0000 Encounter for general adult medical examination without abnormal findings: Secondary | ICD-10-CM | POA: Diagnosis not present

## 2023-09-25 DIAGNOSIS — Z23 Encounter for immunization: Secondary | ICD-10-CM | POA: Diagnosis not present

## 2023-09-25 DIAGNOSIS — Z1212 Encounter for screening for malignant neoplasm of rectum: Secondary | ICD-10-CM | POA: Diagnosis not present

## 2023-10-06 DIAGNOSIS — M7651 Patellar tendinitis, right knee: Secondary | ICD-10-CM | POA: Diagnosis not present

## 2023-10-06 DIAGNOSIS — R29898 Other symptoms and signs involving the musculoskeletal system: Secondary | ICD-10-CM | POA: Diagnosis not present

## 2023-10-06 DIAGNOSIS — M25561 Pain in right knee: Secondary | ICD-10-CM | POA: Diagnosis not present

## 2023-10-06 DIAGNOSIS — M25661 Stiffness of right knee, not elsewhere classified: Secondary | ICD-10-CM | POA: Diagnosis not present

## 2023-10-22 DIAGNOSIS — L821 Other seborrheic keratosis: Secondary | ICD-10-CM | POA: Diagnosis not present

## 2023-10-22 DIAGNOSIS — D2262 Melanocytic nevi of left upper limb, including shoulder: Secondary | ICD-10-CM | POA: Diagnosis not present

## 2023-10-22 DIAGNOSIS — D2261 Melanocytic nevi of right upper limb, including shoulder: Secondary | ICD-10-CM | POA: Diagnosis not present

## 2023-10-22 DIAGNOSIS — D225 Melanocytic nevi of trunk: Secondary | ICD-10-CM | POA: Diagnosis not present

## 2024-07-12 DIAGNOSIS — J40 Bronchitis, not specified as acute or chronic: Secondary | ICD-10-CM | POA: Diagnosis not present

## 2024-07-12 DIAGNOSIS — R051 Acute cough: Secondary | ICD-10-CM | POA: Diagnosis not present

## 2024-07-12 DIAGNOSIS — Z23 Encounter for immunization: Secondary | ICD-10-CM | POA: Diagnosis not present

## 2024-10-06 DIAGNOSIS — R82998 Other abnormal findings in urine: Secondary | ICD-10-CM | POA: Diagnosis not present

## 2024-10-06 DIAGNOSIS — Z1339 Encounter for screening examination for other mental health and behavioral disorders: Secondary | ICD-10-CM | POA: Diagnosis not present

## 2024-10-06 DIAGNOSIS — Z1331 Encounter for screening for depression: Secondary | ICD-10-CM | POA: Diagnosis not present

## 2024-10-06 DIAGNOSIS — Z0000 Encounter for general adult medical examination without abnormal findings: Secondary | ICD-10-CM | POA: Diagnosis not present
